# Patient Record
Sex: Female | Born: 1995 | Race: White | Hispanic: No | Marital: Single | Smoking: Current some day smoker
Health system: Southern US, Community
[De-identification: ages and names within clinical notes are randomized; demographics above are authoritative.]

---
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
## 2018-07-05 ENCOUNTER — Other Ambulatory Visit: Payer: Self-pay

## 2018-07-05 ENCOUNTER — Encounter: Payer: Self-pay | Admitting: Emergency Medicine

## 2018-07-05 ENCOUNTER — Emergency Department
Admission: EM | Admit: 2018-07-05 | Discharge: 2018-07-05 | Disposition: A | Discharge status: Home / Self Care | Payer: PRIVATE HEALTH INSURANCE | Attending: Emergency Medicine | Admitting: Emergency Medicine

## 2018-07-05 ENCOUNTER — Emergency Department: Payer: PRIVATE HEALTH INSURANCE

## 2018-07-05 DIAGNOSIS — Z5321 Procedure and treatment not carried out due to patient leaving prior to being seen by health care provider: Secondary | ICD-10-CM | POA: Diagnosis not present

## 2018-07-05 DIAGNOSIS — R109 Unspecified abdominal pain: Secondary | ICD-10-CM | POA: Insufficient documentation

## 2018-07-05 LAB — BASIC METABOLIC PANEL
Anion gap: 8 (ref 5–15)
BUN: 7 mg/dL (ref 6–20)
CO2: 25 mmol/L (ref 22–32)
Calcium: 8.7 mg/dL — ABNORMAL LOW (ref 8.9–10.3)
Chloride: 104 mmol/L (ref 98–111)
Creatinine, Ser: 0.63 mg/dL (ref 0.44–1.00)
GFR calc Af Amer: >60 mL/min (ref >60)
GFR calc non Af Amer: >60 mL/min (ref >60)
Glucose, Bld: 139 mg/dL — ABNORMAL HIGH (ref 70–99)
Potassium: 3.5 mmol/L (ref 3.5–5.1)
Sodium: 137 mmol/L (ref 135–145)

## 2018-07-05 LAB — CBC
HCT: 38.6 % (ref 36.0–46.0)
Hemoglobin: 13 g/dL (ref 12.0–15.0)
MCH: 30.7 pg (ref 26.0–34.0)
MCHC: 33.7 g/dL (ref 30.0–36.0)
MCV: 91.3 fL (ref 80.0–100.0)
Platelets: 317 10*3/uL (ref 150–400)
RBC: 4.23 MIL/uL (ref 3.87–5.11)
RDW: 12 % (ref 11.5–15.5)
WBC: 8 10*3/uL (ref 4.0–10.5)
nRBC: 0 % (ref 0.0–0.2)

## 2018-07-05 LAB — URINALYSIS, COMPLETE (UACMP) WITH MICROSCOPIC
Bacteria, UA: NONE SEEN
Bilirubin Urine: NEGATIVE
Glucose, UA: NEGATIVE mg/dL
Hgb urine dipstick: NEGATIVE
Ketones, ur: NEGATIVE mg/dL
Leukocytes,Ua: NEGATIVE
Nitrite: NEGATIVE
Protein, ur: NEGATIVE mg/dL
Specific Gravity, Urine: 1.014 (ref 1.005–1.030)
Squamous Epithelial / LPF: NONE SEEN (ref 0–5)
WBC, UA: NONE SEEN WBC/hpf (ref 0–5)
pH: 7 (ref 5.0–8.0)

## 2018-07-05 LAB — HCG, QUANTITATIVE, PREGNANCY: hCG, Beta Chain, Quant, S: 25816 m[IU]/mL — ABNORMAL HIGH (ref <5)

## 2018-07-05 NOTE — ED Notes (Signed)
Patient called in lobby. No answer. Patient not in bathroom or outside.

## 2018-07-05 NOTE — ED Notes (Signed)
Patient called. No answer. Patient not outside or in restroom.

## 2018-07-05 NOTE — ED Triage Notes (Signed)
First NUrse Notes:  Arrives with positive pregnancy test this morning.  Patient has an IUD in place. Sent to ED by OBGYN to evaluate for ectopic pregnancy.  Patient is AAOx3.  Skin warm and dry. NAD

## 2018-07-05 NOTE — ED Notes (Signed)
This RN unable to find patient in lobby, outside, or in restrooms. Patient's home and mobile numbers called; no answer to either number. Patient has not set up identification on messaging system, so no message left.

## 2018-07-05 NOTE — ED Notes (Signed)
Ultrasound tech informed this RN that she is unable to find patient in lobby. This RN assisted tech and was also unable to find patient in lobby, outside, or in bathrooms.

## 2018-07-05 NOTE — ED Triage Notes (Signed)
Pt sent to ER by OB/GYN to rule out ectopic pregnancy as she has IUD in place.  Pt states she is approx 1 1/2 weeks late for her period.  States some mild cramping, denies vaginal bleeding.

## 2018-11-25 ENCOUNTER — Emergency Department
Admission: EM | Admit: 2018-11-25 | Discharge: 2018-11-25 | Disposition: A | Discharge status: Home / Self Care | Payer: Medicaid Other | Attending: Emergency Medicine | Admitting: Emergency Medicine

## 2018-11-25 ENCOUNTER — Other Ambulatory Visit: Payer: Self-pay

## 2018-11-25 ENCOUNTER — Emergency Department: Payer: Medicaid Other

## 2018-11-25 ENCOUNTER — Encounter: Payer: Self-pay | Admitting: Intensive Care

## 2018-11-25 DIAGNOSIS — R0789 Other chest pain: Secondary | ICD-10-CM | POA: Diagnosis present

## 2018-11-25 DIAGNOSIS — F17210 Nicotine dependence, cigarettes, uncomplicated: Secondary | ICD-10-CM | POA: Insufficient documentation

## 2018-11-25 DIAGNOSIS — M940 Chondrocostal junction syndrome [Tietze]: Secondary | ICD-10-CM | POA: Insufficient documentation

## 2018-11-25 LAB — BASIC METABOLIC PANEL
Anion gap: 11 (ref 5–15)
BUN: 9 mg/dL (ref 6–20)
CO2: 25 mmol/L (ref 22–32)
Calcium: 9 mg/dL (ref 8.9–10.3)
Chloride: 104 mmol/L (ref 98–111)
Creatinine, Ser: 0.58 mg/dL (ref 0.44–1.00)
GFR calc Af Amer: >60 mL/min (ref >60)
GFR calc non Af Amer: >60 mL/min (ref >60)
Glucose, Bld: 100 mg/dL — ABNORMAL HIGH (ref 70–99)
Potassium: 3.7 mmol/L (ref 3.5–5.1)
Sodium: 140 mmol/L (ref 135–145)

## 2018-11-25 LAB — FIBRIN DERIVATIVES D-DIMER (ARMC ONLY): Fibrin derivatives D-dimer (ARMC): 154.96 ng/mL (FEU) (ref 0.00–499.00)

## 2018-11-25 LAB — CBC
HCT: 39 % (ref 36.0–46.0)
Hemoglobin: 12.9 g/dL (ref 12.0–15.0)
MCH: 28.9 pg (ref 26.0–34.0)
MCHC: 33.1 g/dL (ref 30.0–36.0)
MCV: 87.4 fL (ref 80.0–100.0)
Platelets: 412 10*3/uL — ABNORMAL HIGH (ref 150–400)
RBC: 4.46 MIL/uL (ref 3.87–5.11)
RDW: 12.2 % (ref 11.5–15.5)
WBC: 10.5 10*3/uL (ref 4.0–10.5)
nRBC: 0 % (ref 0.0–0.2)

## 2018-11-25 LAB — TROPONIN I (HIGH SENSITIVITY)
Troponin I (High Sensitivity): <2 ng/L (ref <18)
Troponin I (High Sensitivity): <2 ng/L (ref <18)

## 2018-11-25 LAB — POCT PREGNANCY, URINE: Preg Test, Ur: NEGATIVE

## 2018-11-25 MED ORDER — NAPROXEN 500 MG PO TABS: 500.0000 mg | ORAL_TABLET | Freq: Two times a day (BID) | ORAL | 0 refills | Status: AC

## 2018-11-25 MED ORDER — CYCLOBENZAPRINE HCL 10 MG PO TABS: 10.0000 mg | ORAL_TABLET | Freq: Three times a day (TID) | ORAL | 0 refills | Status: DC | PRN

## 2018-11-25 MED ORDER — KETOROLAC TROMETHAMINE 30 MG/ML IJ SOLN
30.0000 mg | Freq: Once | INTRAMUSCULAR | Status: AC
  Administered 2018-11-25: 30 mg via INTRAVENOUS
  Filled 2018-11-25: qty 1

## 2018-11-25 NOTE — ED Triage Notes (Signed)
Patient c/o right sided stabbing chest pain X3 weeks. No radiation Hard to move, lift stuff, or sleep on that side.

## 2018-11-25 NOTE — Discharge Instructions (Signed)
Take the prescribed Naproxen twice a day with food. Do NOT take Alleve, Ibuprofen, or other NSAID medications while taking this.  Take Tylenol 1000 mg every 6 hours for additional pain control  Try the Flexeril prescribed for muscle pain/spasms

## 2018-11-25 NOTE — ED Notes (Signed)
See triage note  Presents with pain to right side of chest  Pain is mainly under right breast area   States she has had a cold recently  Pain increases with cough or movement  States nothing makes the pain better or worse  Low grade temp on arrival

## 2018-11-25 NOTE — ED Notes (Signed)
Pt called to be roomed with no response. Will try again.

## 2018-11-25 NOTE — ED Provider Notes (Signed)
[facility] Emergency Department Provider Note  ____________________________________________   First MD Initiated Contact with Patient 11/25/18 1927     (approximate)  I have reviewed the triage vital signs and the nursing notes.   HISTORY  Chief Complaint Chest Pain    HPI Dawn Valdez is a 23 y.o. female  Here with chest pain. Pt reports that for the past 3 weeks, she has had constant, unchanging, sharp, positional, right sided chest pain. The pain is worse along the right lower chest wall, positional, worse w/ movement and palpation. No alleviating factors. Pain is slightly worse w/ inspiration. No cough. No hemoptysis. No LE swelling. No other sx. She is on OCPs but ran out a week ago.       History reviewed. No pertinent past medical history.  There are no active problems to display for this patient.   History reviewed. No pertinent surgical history.  Prior to Admission medications   Medication Sig Start Date End Date Taking? Authorizing Provider  cyclobenzaprine (FLEXERIL) 10 MG tablet Take 1 tablet (10 mg total) by mouth 3 (three) times daily as needed for muscle spasms. 11/25/18   Duffy Bruce, MD  ibuprofen (ADVIL,MOTRIN) 200 MG tablet Take 400 mg by mouth every 6 (six) hours as needed for headache (headache).    [provider]  naproxen (NAPROSYN) 500 MG tablet Take 1 tablet (500 mg total) by mouth 2 (two) times daily with a meal for 7 days. 11/25/18 12/02/18  Duffy Bruce, MD  norethindrone-ethinyl estradiol-iron (ESTROSTEP FE,TILIA FE,TRI-LEGEST FE) 1-20/1-30/1-35 MG-MCG tablet Take 1 tablet by mouth at bedtime.    [provider]    Allergies Patient has no known allergies.  History reviewed. No pertinent family history.  Social History Social History   Tobacco Use  . Smoking status: Current Some Day Smoker    Types: Cigarettes  . Smokeless tobacco: Never Used  Substance Use Topics  . Alcohol use: Yes     Comment: occ  . Drug use: Never    Review of Systems  Review of Systems  Constitutional: Negative for fatigue and fever.  HENT: Negative for congestion and sore throat.   Eyes: Negative for visual disturbance.  Respiratory: Positive for chest tightness. Negative for cough and shortness of breath.   Cardiovascular: Positive for chest pain.  Gastrointestinal: Negative for abdominal pain, diarrhea, nausea and vomiting.  Genitourinary: Negative for flank pain.  Musculoskeletal: Negative for back pain and neck pain.  Skin: Negative for rash and wound.  Neurological: Negative for weakness.  All other systems reviewed and are negative.    ____________________________________________  PHYSICAL EXAM:      VITAL SIGNS: ED Triage Vitals  Enc Vitals Group     BP 11/25/18 1732 135/84     Pulse Rate 11/25/18 1732 85     Resp 11/25/18 1732 16     Temp 11/25/18 1732 99.4 F (37.4 C)     Temp Source 11/25/18 1732 Oral     SpO2 11/25/18 1732 99 %     Weight 11/25/18 1735 110 lb (49.9 kg)     Height 11/25/18 1735 5\' 7"  (1.702 m)     Head Circumference --      Peak Flow --      Pain Score 11/25/18 1734 0     Pain Loc --      Pain Edu? --      Excl. in [place]? --      Physical Exam Vitals signs and nursing  note reviewed.  Constitutional:      General: She is not in acute distress.    Appearance: She is well-developed.  HENT:     Head: Normocephalic and atraumatic.  Eyes:     Conjunctiva/sclera: Conjunctivae normal.  Neck:     Musculoskeletal: Neck supple.  Cardiovascular:     Rate and Rhythm: Normal rate.     Heart sounds: Normal heart sounds. No murmur. No friction rub.  Pulmonary:     Effort: Pulmonary effort is normal. No respiratory distress.     Breath sounds: Normal breath sounds. No wheezing or rales.     Comments: TTP over right anterolateral chest wall. No bruising or deformity. No redness, rash, lesions. Abdominal:     General: There is no distension.      Palpations: Abdomen is soft.     Tenderness: There is no abdominal tenderness.  Skin:    General: Skin is warm.     Capillary Refill: Capillary refill takes less than 2 seconds.  Neurological:     Mental Status: She is alert and oriented to person, place, and time.     Motor: No abnormal muscle tone.       ____________________________________________   LABS (all labs ordered are listed, but only abnormal results are displayed)  Labs Reviewed  BASIC METABOLIC PANEL - Abnormal; Notable for the following components:      Result Value   Glucose, Bld 100 (*)    All other components within normal limits  CBC - Abnormal; Notable for the following components:   Platelets 412 (*)    All other components within normal limits  FIBRIN DERIVATIVES D-DIMER ([facility] ONLY)  POC URINE PREG, ED  POCT PREGNANCY, URINE  TROPONIN I (HIGH SENSITIVITY)  TROPONIN I (HIGH SENSITIVITY)    ____________________________________________  EKG: Normal sinus rhythm, 84. PR 140, QRS 86, QTc 444. No acute ST elevations or depressions. ________________________________________  RADIOLOGY All imaging, including plain films, CT scans, and ultrasounds, independently reviewed by me, and interpretations confirmed via formal radiology reads.  ED MD interpretation:   CXR: Clear  Official radiology report(s): Dg Chest 2 View  Result Date: 11/25/2018 CLINICAL DATA:  Consistent right-sided chest pain under breast for 3 weeks EXAM: CHEST - 2 VIEW COMPARISON:  None. FINDINGS: No consolidation, features of edema, pneumothorax, or effusion. Pulmonary vascularity is normally distributed. The cardiomediastinal contours are unremarkable. No acute osseous or soft tissue abnormality. IMPRESSION: No acute cardiopulmonary abnormality. Electronically Signed   By: Kreg Shropshire M.D.   On: 11/25/2018 18:07    ____________________________________________  PROCEDURES   Procedure(s) performed (including Critical Care):   Procedures  ____________________________________________  INITIAL IMPRESSION / MDM / ASSESSMENT AND PLAN / ED COURSE  As part of my medical decision making, I reviewed the following data within the electronic MEDICAL RECORD NUMBER Nursing notes reviewed and incorporated, Old chart reviewed, Notes from prior ED visits, and [place] Controlled Substance Database       *Brande Uncapher was evaluated in Emergency Department on 11/25/2018 for the symptoms described in the history of present illness. She was evaluated in the context of the global COVID-19 pandemic, which necessitated consideration that the patient might be at risk for infection with the SARS-CoV-2 virus that causes COVID-19. Institutional protocols and algorithms that pertain to the evaluation of patients at risk for COVID-19 are in a state of rapid change based on information released by regulatory bodies including the CDC and federal and state organizations. These policies and algorithms were followed  during the patient's care in the ED.  Some ED evaluations and interventions may be delayed as a result of limited staffing during the pandemic.*  Clinical Course as of Nov 24 2253  Mon Nov 25, 2018  1957 23 yo F here with atypical, reproducible, likely MSK chest wall pain. Pain is reproducible on exam. Trop neg x 2 with nonischemic EKG and HEART<3 - doubt ACS. No signs of DVT/PE clinically and D-Dimer is negative. Tx with NSAIDs, outpt follow-up.   [CI]    Clinical Course User Index [CI] Shaune PollackIsaacs, Mishawn Hemann, MD    Medical Decision Making:  As above.   ____________________________________________  FINAL CLINICAL IMPRESSION(S) / ED DIAGNOSES  Final diagnoses:  Atypical chest pain  Costochondritis     MEDICATIONS GIVEN DURING THIS VISIT:  Medications  ketorolac (TORADOL) 30 MG/ML injection 30 mg (30 mg Intravenous Given 11/25/18 2037)     ED Discharge Orders         Ordered    cyclobenzaprine (FLEXERIL) 10 MG tablet  3 times daily  PRN     11/25/18 2018    naproxen (NAPROSYN) 500 MG tablet  2 times daily with meals     11/25/18 2018           Note:  This document was prepared using Dragon voice recognition software and may include unintentional dictation errors.   Shaune PollackIsaacs, Duilio Heritage, MD 11/25/18 2255

## 2019-02-01 ENCOUNTER — Emergency Department (HOSPITAL_COMMUNITY): Payer: Medicaid Other

## 2019-02-01 ENCOUNTER — Inpatient Hospital Stay (HOSPITAL_COMMUNITY)
Admission: EM | Admit: 2019-02-01 | Discharge: 2019-02-06 | DRG: 201 | Discharge status: Against Medical Advice | Payer: Medicaid Other | Attending: General Surgery | Admitting: General Surgery

## 2019-02-01 DIAGNOSIS — Z20822 Contact with and (suspected) exposure to covid-19: Secondary | ICD-10-CM | POA: Diagnosis present

## 2019-02-01 DIAGNOSIS — F17210 Nicotine dependence, cigarettes, uncomplicated: Secondary | ICD-10-CM | POA: Diagnosis present

## 2019-02-01 DIAGNOSIS — Z5329 Procedure and treatment not carried out because of patient's decision for other reasons: Secondary | ICD-10-CM | POA: Diagnosis not present

## 2019-02-01 DIAGNOSIS — S270XXA Traumatic pneumothorax, initial encounter: Secondary | ICD-10-CM | POA: Diagnosis present

## 2019-02-01 DIAGNOSIS — S022XXA Fracture of nasal bones, initial encounter for closed fracture: Secondary | ICD-10-CM | POA: Diagnosis present

## 2019-02-01 DIAGNOSIS — R0602 Shortness of breath: Secondary | ICD-10-CM | POA: Diagnosis present

## 2019-02-01 DIAGNOSIS — Z793 Long term (current) use of hormonal contraceptives: Secondary | ICD-10-CM

## 2019-02-01 DIAGNOSIS — J939 Pneumothorax, unspecified: Secondary | ICD-10-CM

## 2019-02-01 DIAGNOSIS — Y92410 Unspecified street and highway as the place of occurrence of the external cause: Secondary | ICD-10-CM

## 2019-02-01 DIAGNOSIS — Z23 Encounter for immunization: Secondary | ICD-10-CM

## 2019-02-01 DIAGNOSIS — S80812A Abrasion, left lower leg, initial encounter: Secondary | ICD-10-CM | POA: Diagnosis present

## 2019-02-01 DIAGNOSIS — S0081XA Abrasion of other part of head, initial encounter: Secondary | ICD-10-CM | POA: Diagnosis present

## 2019-02-01 DIAGNOSIS — J9690 Respiratory failure, unspecified, unspecified whether with hypoxia or hypercapnia: Secondary | ICD-10-CM

## 2019-02-01 DIAGNOSIS — F10920 Alcohol use, unspecified with intoxication, uncomplicated: Secondary | ICD-10-CM

## 2019-02-01 LAB — COMPREHENSIVE METABOLIC PANEL
ALT: 17 U/L (ref 0–44)
AST: 22 U/L (ref 15–41)
Albumin: 4.2 g/dL (ref 3.5–5.0)
Alkaline Phosphatase: 58 U/L (ref 38–126)
Anion gap: 10 (ref 5–15)
BUN: 7 mg/dL (ref 6–20)
CO2: 25 mmol/L (ref 22–32)
Calcium: 8.9 mg/dL (ref 8.9–10.3)
Chloride: 109 mmol/L (ref 98–111)
Creatinine, Ser: 0.65 mg/dL (ref 0.44–1.00)
GFR calc Af Amer: >60 mL/min (ref >60)
GFR calc non Af Amer: >60 mL/min (ref >60)
Glucose, Bld: 114 mg/dL — ABNORMAL HIGH (ref 70–99)
Potassium: 3.7 mmol/L (ref 3.5–5.1)
Sodium: 144 mmol/L (ref 135–145)
Total Bilirubin: 1.1 mg/dL (ref 0.3–1.2)
Total Protein: 6.7 g/dL (ref 6.5–8.1)

## 2019-02-01 LAB — CBC WITH DIFFERENTIAL/PLATELET
Abs Immature Granulocytes: 0.05 10*3/uL (ref 0.00–0.07)
Basophils Absolute: 0 10*3/uL (ref 0.0–0.1)
Basophils Relative: 0 %
Eosinophils Absolute: 0 10*3/uL (ref 0.0–0.5)
Eosinophils Relative: 0 %
HCT: 41 % (ref 36.0–46.0)
Hemoglobin: 13.2 g/dL (ref 12.0–15.0)
Immature Granulocytes: 1 %
Lymphocytes Relative: 20 %
Lymphs Abs: 1.9 10*3/uL (ref 0.7–4.0)
MCH: 29.8 pg (ref 26.0–34.0)
MCHC: 32.2 g/dL (ref 30.0–36.0)
MCV: 92.6 fL (ref 80.0–100.0)
Monocytes Absolute: 0.5 10*3/uL (ref 0.1–1.0)
Monocytes Relative: 5 %
Neutro Abs: 7.3 10*3/uL (ref 1.7–7.7)
Neutrophils Relative %: 74 %
Platelets: 352 10*3/uL (ref 150–400)
RBC: 4.43 MIL/uL (ref 3.87–5.11)
RDW: 12.7 % (ref 11.5–15.5)
WBC: 9.7 10*3/uL (ref 4.0–10.5)
nRBC: 0 % (ref 0.0–0.2)

## 2019-02-01 LAB — URINALYSIS, ROUTINE W REFLEX MICROSCOPIC
Bilirubin Urine: NEGATIVE
Glucose, UA: NEGATIVE mg/dL
Hgb urine dipstick: NEGATIVE
Ketones, ur: NEGATIVE mg/dL
Leukocytes,Ua: NEGATIVE
Nitrite: NEGATIVE
Protein, ur: NEGATIVE mg/dL
Specific Gravity, Urine: 1.005 (ref 1.005–1.030)
pH: 6 (ref 5.0–8.0)

## 2019-02-01 LAB — RESPIRATORY PANEL BY RT PCR (FLU A&B, COVID)
Influenza A by PCR: NEGATIVE
Influenza B by PCR: NEGATIVE
SARS Coronavirus 2 by RT PCR: NEGATIVE

## 2019-02-01 LAB — RAPID URINE DRUG SCREEN, HOSP PERFORMED
Amphetamines: NOT DETECTED
Barbiturates: NOT DETECTED
Benzodiazepines: NOT DETECTED
Cocaine: NOT DETECTED
Opiates: NOT DETECTED
Tetrahydrocannabinol: NOT DETECTED

## 2019-02-01 LAB — I-STAT BETA HCG BLOOD, ED (MC, WL, AP ONLY): I-stat hCG, quantitative: <5 m[IU]/mL (ref <5)

## 2019-02-01 LAB — ETHANOL: Alcohol, Ethyl (B): 183 mg/dL — ABNORMAL HIGH (ref <10)

## 2019-02-01 MED ORDER — ONDANSETRON 4 MG PO TBDP: 4.0000 mg | ORAL_TABLET | Freq: Four times a day (QID) | ORAL | Status: DC | PRN

## 2019-02-01 MED ORDER — ENOXAPARIN SODIUM 40 MG/0.4ML ~~LOC~~ SOLN
40.0000 mg | SUBCUTANEOUS | Status: DC
  Administered 2019-02-01 – 2019-02-05 (×5): 40 mg via SUBCUTANEOUS
  Filled 2019-02-01 (×5): qty 0.4

## 2019-02-01 MED ORDER — LORAZEPAM 2 MG/ML IJ SOLN
1.0000 mg | Freq: Once | INTRAMUSCULAR | Status: DC
  Filled 2019-02-01: qty 1

## 2019-02-01 MED ORDER — LIDOCAINE-EPINEPHRINE (PF) 2 %-1:200000 IJ SOLN
20.0000 mL | Freq: Once | INTRAMUSCULAR | Status: AC
  Administered 2019-02-01: 20 mL via INTRADERMAL
  Filled 2019-02-01: qty 20

## 2019-02-01 MED ORDER — ACETAMINOPHEN 500 MG PO TABS
1000.0000 mg | ORAL_TABLET | Freq: Three times a day (TID) | ORAL | Status: DC
  Administered 2019-02-01 – 2019-02-06 (×15): 1000 mg via ORAL
  Filled 2019-02-01 (×15): qty 2

## 2019-02-01 MED ORDER — DOCUSATE SODIUM 100 MG PO CAPS
100.0000 mg | ORAL_CAPSULE | Freq: Two times a day (BID) | ORAL | Status: DC
  Administered 2019-02-01 – 2019-02-05 (×7): 100 mg via ORAL
  Filled 2019-02-01 (×9): qty 1

## 2019-02-01 MED ORDER — KETOROLAC TROMETHAMINE 30 MG/ML IJ SOLN: 15.0000 mg | Freq: Three times a day (TID) | INTRAMUSCULAR | Status: AC | PRN

## 2019-02-01 MED ORDER — FENTANYL CITRATE (PF) 100 MCG/2ML IJ SOLN
50.0000 ug | Freq: Once | INTRAMUSCULAR | Status: AC
  Administered 2019-02-01: 50 ug via INTRAVENOUS

## 2019-02-01 MED ORDER — ONDANSETRON HCL 4 MG/2ML IJ SOLN
4.0000 mg | Freq: Once | INTRAMUSCULAR | Status: AC
  Administered 2019-02-01: 4 mg via INTRAVENOUS
  Filled 2019-02-01: qty 2

## 2019-02-01 MED ORDER — OXYCODONE HCL 5 MG PO TABS: 5.0000 mg | ORAL_TABLET | ORAL | Status: DC | PRN

## 2019-02-01 MED ORDER — MORPHINE SULFATE (PF) 2 MG/ML IV SOLN
1.0000 mg | INTRAVENOUS | Status: DC | PRN
  Administered 2019-02-01 (×3): 2 mg via INTRAVENOUS
  Filled 2019-02-01 (×3): qty 1

## 2019-02-01 MED ORDER — PANTOPRAZOLE SODIUM 40 MG IV SOLR: 40.0000 mg | Freq: Every day | INTRAVENOUS | Status: DC

## 2019-02-01 MED ORDER — OXYCODONE HCL 5 MG PO TABS
10.0000 mg | ORAL_TABLET | ORAL | Status: DC | PRN
  Administered 2019-02-01 – 2019-02-02 (×5): 10 mg via ORAL
  Filled 2019-02-01 (×5): qty 2

## 2019-02-01 MED ORDER — SODIUM CHLORIDE 0.9 % IV BOLUS (SEPSIS)
1000.0000 mL | Freq: Once | INTRAVENOUS | Status: AC
  Administered 2019-02-01: 1000 mL via INTRAVENOUS

## 2019-02-01 MED ORDER — PANTOPRAZOLE SODIUM 40 MG PO TBEC
40.0000 mg | DELAYED_RELEASE_TABLET | Freq: Every day | ORAL | Status: DC
  Administered 2019-02-02: 40 mg via ORAL
  Filled 2019-02-01: qty 1

## 2019-02-01 MED ORDER — TETANUS-DIPHTH-ACELL PERTUSSIS 5-2.5-18.5 LF-MCG/0.5 IM SUSP
0.5000 mL | Freq: Once | INTRAMUSCULAR | Status: AC
  Administered 2019-02-01: 0.5 mL via INTRAMUSCULAR
  Filled 2019-02-01: qty 0.5

## 2019-02-01 MED ORDER — KCL IN DEXTROSE-NACL 20-5-0.45 MEQ/L-%-% IV SOLN
INTRAVENOUS | Status: DC
  Filled 2019-02-01 (×2): qty 1000

## 2019-02-01 MED ORDER — IOHEXOL 300 MG/ML  SOLN
75.0000 mL | Freq: Once | INTRAMUSCULAR | Status: AC | PRN
  Administered 2019-02-01: 75 mL via INTRAVENOUS

## 2019-02-01 MED ORDER — FENTANYL CITRATE (PF) 100 MCG/2ML IJ SOLN
50.0000 ug | Freq: Once | INTRAMUSCULAR | Status: AC
  Administered 2019-02-01: 50 ug via INTRAVENOUS
  Filled 2019-02-01: qty 2

## 2019-02-01 MED ORDER — ONDANSETRON HCL 4 MG/2ML IJ SOLN: 4.0000 mg | Freq: Four times a day (QID) | INTRAMUSCULAR | Status: DC | PRN
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
## 2019-02-01 NOTE — ED Notes (Signed)
Attempted report x 3.  
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
## 2019-02-01 NOTE — ED Notes (Signed)
Attempted report x1. 

## 2019-02-02 ENCOUNTER — Encounter (HOSPITAL_COMMUNITY): Payer: Self-pay

## 2019-02-02 ENCOUNTER — Inpatient Hospital Stay (HOSPITAL_COMMUNITY): Payer: Medicaid Other

## 2019-02-02 LAB — BASIC METABOLIC PANEL
Anion gap: 9 (ref 5–15)
BUN: 5 mg/dL — ABNORMAL LOW (ref 6–20)
CO2: 23 mmol/L (ref 22–32)
Calcium: 8.4 mg/dL — ABNORMAL LOW (ref 8.9–10.3)
Chloride: 105 mmol/L (ref 98–111)
Creatinine, Ser: 0.48 mg/dL (ref 0.44–1.00)
GFR calc Af Amer: >60 mL/min (ref >60)
GFR calc non Af Amer: >60 mL/min (ref >60)
Glucose, Bld: 95 mg/dL (ref 70–99)
Potassium: 3.7 mmol/L (ref 3.5–5.1)
Sodium: 137 mmol/L (ref 135–145)

## 2019-02-02 LAB — CBC
HCT: 37.1 % (ref 36.0–46.0)
Hemoglobin: 12.1 g/dL (ref 12.0–15.0)
MCH: 29.8 pg (ref 26.0–34.0)
MCHC: 32.6 g/dL (ref 30.0–36.0)
MCV: 91.4 fL (ref 80.0–100.0)
Platelets: 248 10*3/uL (ref 150–400)
RBC: 4.06 MIL/uL (ref 3.87–5.11)
RDW: 12.5 % (ref 11.5–15.5)
WBC: 7.4 10*3/uL (ref 4.0–10.5)
nRBC: 0 % (ref 0.0–0.2)

## 2019-02-02 LAB — HIV ANTIBODY (ROUTINE TESTING W REFLEX): HIV Screen 4th Generation wRfx: NONREACTIVE

## 2019-02-02 NOTE — Progress Notes (Signed)
Patient placed on 2L of oxygen, chest tube suction increased to 30 mmHg, incentive spirometer given, patient educated, and flutter valve ordered this am per Dr. Tawana Scale order.
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
## 2019-02-02 NOTE — Progress Notes (Signed)
Patient walked 2 laps around progressive/ICU side of the unit. RN was stand-by and holding chest tube collection chamber. Patient needed minimal assistance. Went back to bed when walk was done with SCD's on.

## 2019-02-02 NOTE — Progress Notes (Signed)
Patient's chest tube has 25 mL output total. The chamber was not marked prior to my shift.

## 2019-02-03 ENCOUNTER — Inpatient Hospital Stay (HOSPITAL_COMMUNITY): Payer: Medicaid Other

## 2019-02-03 MED ORDER — OXYCODONE HCL 5 MG/5ML PO SOLN
5.0000 mg | ORAL | Status: DC | PRN
  Administered 2019-02-04: 10 mg via ORAL
  Filled 2019-02-03 (×2): qty 10

## 2019-02-03 MED ORDER — METHOCARBAMOL 500 MG PO TABS
1000.0000 mg | ORAL_TABLET | Freq: Three times a day (TID) | ORAL | Status: DC
  Administered 2019-02-03 – 2019-02-06 (×10): 1000 mg via ORAL
  Filled 2019-02-03 (×10): qty 2

## 2019-02-03 NOTE — Progress Notes (Signed)
Recieved patient from 4N. Patient alert and oriented x4. No complaints of pain at this time. Chest tube to 30 suction. Call bell within reach. Will continue to monitor.
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
## 2019-02-03 NOTE — Progress Notes (Signed)
Patient transferred to a med-surg unit, per Dr. Bedelia Person. Report called and patient transported to [facility]. All belongings packed and taken with patient.

## 2019-02-04 ENCOUNTER — Inpatient Hospital Stay (HOSPITAL_COMMUNITY): Payer: Medicaid Other

## 2019-02-04 LAB — CBC
HCT: 39.1 % (ref 36.0–46.0)
Hemoglobin: 12.9 g/dL (ref 12.0–15.0)
MCH: 29.5 pg (ref 26.0–34.0)
MCHC: 33 g/dL (ref 30.0–36.0)
MCV: 89.5 fL (ref 80.0–100.0)
Platelets: 316 10*3/uL (ref 150–400)
RBC: 4.37 MIL/uL (ref 3.87–5.11)
RDW: 12.5 % (ref 11.5–15.5)
WBC: 9.2 10*3/uL (ref 4.0–10.5)
nRBC: 0 % (ref 0.0–0.2)

## 2019-02-04 LAB — PHOSPHORUS: Phosphorus: 3.6 mg/dL (ref 2.5–4.6)

## 2019-02-04 LAB — MAGNESIUM: Magnesium: 1.6 mg/dL — ABNORMAL LOW (ref 1.7–2.4)

## 2019-02-04 LAB — BASIC METABOLIC PANEL
Anion gap: 8 (ref 5–15)
BUN: 11 mg/dL (ref 6–20)
CO2: 27 mmol/L (ref 22–32)
Calcium: 8.7 mg/dL — ABNORMAL LOW (ref 8.9–10.3)
Chloride: 105 mmol/L (ref 98–111)
Creatinine, Ser: 0.67 mg/dL (ref 0.44–1.00)
GFR calc Af Amer: >60 mL/min (ref >60)
GFR calc non Af Amer: >60 mL/min (ref >60)
Glucose, Bld: 108 mg/dL — ABNORMAL HIGH (ref 70–99)
Potassium: 3.4 mmol/L — ABNORMAL LOW (ref 3.5–5.1)
Sodium: 140 mmol/L (ref 135–145)
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
## 2019-02-04 NOTE — Plan of Care (Signed)
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
## 2019-02-05 ENCOUNTER — Inpatient Hospital Stay (HOSPITAL_COMMUNITY): Payer: Medicaid Other

## 2019-02-05 LAB — CBC
HCT: 38.7 % (ref 36.0–46.0)
Hemoglobin: 13 g/dL (ref 12.0–15.0)
MCH: 29.9 pg (ref 26.0–34.0)
MCHC: 33.6 g/dL (ref 30.0–36.0)
MCV: 89 fL (ref 80.0–100.0)
Platelets: 307 10*3/uL (ref 150–400)
RBC: 4.35 MIL/uL (ref 3.87–5.11)
RDW: 12.5 % (ref 11.5–15.5)
WBC: 8 10*3/uL (ref 4.0–10.5)
nRBC: 0 % (ref 0.0–0.2)

## 2019-02-05 LAB — BASIC METABOLIC PANEL
Anion gap: 8 (ref 5–15)
BUN: 10 mg/dL (ref 6–20)
CO2: 26 mmol/L (ref 22–32)
Calcium: 8.9 mg/dL (ref 8.9–10.3)
Chloride: 104 mmol/L (ref 98–111)
Creatinine, Ser: 0.54 mg/dL (ref 0.44–1.00)
GFR calc Af Amer: >60 mL/min (ref >60)
GFR calc non Af Amer: >60 mL/min (ref >60)
Glucose, Bld: 96 mg/dL (ref 70–99)
Potassium: 4.1 mmol/L (ref 3.5–5.1)
Sodium: 138 mmol/L (ref 135–145)

## 2019-02-05 LAB — PHOSPHORUS: Phosphorus: 3.8 mg/dL (ref 2.5–4.6)

## 2019-02-05 LAB — MAGNESIUM: Magnesium: 1.8 mg/dL (ref 1.7–2.4)

## 2019-02-05 MED ORDER — MAGNESIUM SULFATE 2 GM/50ML IV SOLN
2.0000 g | Freq: Once | INTRAVENOUS | Status: AC
  Administered 2019-02-05: 2 g via INTRAVENOUS
  Filled 2019-02-05: qty 50
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
## 2019-02-05 NOTE — Plan of Care (Signed)

## 2019-02-06 ENCOUNTER — Inpatient Hospital Stay (HOSPITAL_COMMUNITY): Payer: Medicaid Other

## 2019-02-06 LAB — CBC
HCT: 40.2 % (ref 36.0–46.0)
Hemoglobin: 13.2 g/dL (ref 12.0–15.0)
MCH: 29.5 pg (ref 26.0–34.0)
MCHC: 32.8 g/dL (ref 30.0–36.0)
MCV: 89.9 fL (ref 80.0–100.0)
Platelets: 329 10*3/uL (ref 150–400)
RBC: 4.47 MIL/uL (ref 3.87–5.11)
RDW: 12.5 % (ref 11.5–15.5)
WBC: 8.7 10*3/uL (ref 4.0–10.5)
nRBC: 0 % (ref 0.0–0.2)

## 2019-02-06 LAB — MAGNESIUM: Magnesium: 2.1 mg/dL (ref 1.7–2.4)

## 2019-02-06 LAB — BASIC METABOLIC PANEL
Anion gap: 8 (ref 5–15)
BUN: 14 mg/dL (ref 6–20)
CO2: 27 mmol/L (ref 22–32)
Calcium: 9.1 mg/dL (ref 8.9–10.3)
Chloride: 104 mmol/L (ref 98–111)
Creatinine, Ser: 0.63 mg/dL (ref 0.44–1.00)
GFR calc Af Amer: >60 mL/min (ref >60)
GFR calc non Af Amer: >60 mL/min (ref >60)
Glucose, Bld: 98 mg/dL (ref 70–99)
Potassium: 4 mmol/L (ref 3.5–5.1)
Sodium: 139 mmol/L (ref 135–145)

## 2019-02-06 LAB — PHOSPHORUS: Phosphorus: 4.5 mg/dL (ref 2.5–4.6)

## 2019-02-06 NOTE — Discharge Planning (Signed)
Pt signed AMA form. Discharge paperwork reviewed.

## 2019-02-06 NOTE — Progress Notes (Addendum)
CXR reviewed this AM and persistent PTX. Requested nursing staff transition to back to suction, however patient declined, requesting to leave the hospital. I personally came to evaluate the patient and discussed her indication to stay in the hospital. We discussed her persistent pneumothorax, as well as possible conversion to tension pneumothorax if she left the hospital. It was clearly explained that she would be at risk of worsening her condition or death by leaving the hospital against medical advice. The patient verbalized that she accepts these risks. I explained that we would remove the chest tube prior to her discharge and instructed her to have a low threshold to return to the hospital, especially if she developed any chest pain, shortness of breath, or any other concerning symptoms. She was counseled to take her incentive spirometer and continue using it after leaving the hospital. She verbalized understanding and agreement.   Diamantina Monks, MD General and Trauma Surgery [facility]

## 2019-02-06 NOTE — Discharge Instructions (Signed)
PNEUMOTHORAX OR HEMOTHORAX +/- RIB FRACTURES  HOME INSTRUCTIONS   1. PAIN CONTROL:  1. Pain is best controlled by a usual combination of three different methods TOGETHER:  i. Ice/Heat ii. Over the counter pain medication iii. Prescription pain medication 2. You may experience some swelling and bruising in area of broken ribs. Ice packs or heating pads (30-60 minutes up to 6 times a day) will help. Use ice for the first few days to help decrease swelling and bruising, then switch to heat to help relax tight/sore spots and speed recovery. Some people prefer to use ice alone, heat alone, alternating between ice & heat. Experiment to what works for you. Swelling and bruising can take several weeks to resolve.  3. It is helpful to take an over-the-counter pain medication regularly for the first few weeks. Choose one of the following that works best for you:  i. Naproxen (Aleve, etc) Two 220mg tabs twice a day ii. Ibuprofen (Advil, etc) Three 200mg tabs four times a day (every meal & bedtime) iii. Acetaminophen (Tylenol, etc) 500-650mg four times a day (every meal & bedtime) 4. A prescription for pain medication (such as oxycodone, hydrocodone, etc) may be given to you upon discharge. Take your pain medication as prescribed.  i. If you are having problems/concerns with the prescription medicine (does not control pain, nausea, vomiting, rash, itching, etc), please call us [phone] to see if we need to switch you to a different pain medicine that will work better for you and/or control your side effect better. ii. If you need a refill on your pain medication, please contact your pharmacy. They will contact our office to request authorization. Prescriptions will not be filled after 5 pm or on week-ends. 1. Avoid getting constipated. When taking pain medications, it is common to experience some constipation. Increasing fluid intake and taking a fiber supplement (such as Metamucil, Citrucel, FiberCon,  MiraLax, etc) 1-2 times a day regularly will usually help prevent this problem from occurring. A mild laxative (prune juice, Milk of Magnesia, MiraLax, etc) should be taken according to package directions if there are no bowel movements after 48 hours.  2. Watch out for diarrhea. If you have many loose bowel movements, simplify your diet to bland foods & liquids for a few days. Stop any stool softeners and decrease your fiber supplement. Switching to mild anti-diarrheal medications (Kayopectate, Pepto Bismol) can help. If this worsens or does not improve, please call us. 3. Chest tube site wound: you may remove the dressing from your chest tube site 3 days after the removal of your chest tube. DO NOT shower over the dressing. Once   removed, you may shower as normal. Do not submerge your wound in water for 2-3 weeks.  4. FOLLOW UP  a. Please call our office to set up or confirm an appointment for follow up for 2 weeks after discharge. You will need to get a chest xray at either [facility] or [facility]. This will be outlined in your follow up instructions. Please call [facility] at [phone] if you have any questions about follow up.  b. If you have any orthopedic or other injuries you will need to follow up as outlined in your follow up instructions.   WHEN TO CALL US [phone]:  1. Poor pain control 2. Reactions / problems with new medications (rash/itching, nausea, etc)  3. Fever over 101.5 F (38.5 C) 4. Worsening swelling or bruising 5. Redness, drainage, pain or swelling around chest   tube site 6. Worsening pain, productive cough, difficulty breathing or any other concerning symptoms  The clinic staff is available to answer your questions during regular business hours (8:30am-5pm). Please don't hesitate to call and ask to speak to one of our nurses for clinical concerns.  If you have a medical emergency, go to the nearest emergency room or call 911.  A surgeon from [facility] is always on call at the hospitals   [facility]  [address], [address], [place], [place] [address] ?  MAIN: [phone] ? TOLL FREE: [phone] ?  FAX [phone]  [email or web address]      Information on Rib Fractures  A rib fracture is a break or crack in one of the bones of the ribs. The ribs are long, curved bones that wrap around your chest and attach to your spine and your breastbone. The ribs protect your heart, lungs, and other organs in the chest. A broken or cracked rib is often painful but is not usually serious. Most rib fractures heal on their own over time. However, rib fractures can be more serious if multiple ribs are broken or if broken ribs move out of place and push against other structures or organs. What are the causes? This condition is caused by:  Repetitive movements with high force, such as pitching a baseball or having severe coughing spells.  A direct blow to the chest, such as a sports injury, a car accident, or a fall.  Cancer that has spread to the bones, which can weaken bones and cause them to break. What are the signs or symptoms? Symptoms of this condition include:  Pain when you breathe in or cough.  Pain when someone presses on the injured area.  Feeling short of breath. How is this diagnosed? This condition is diagnosed with a physical exam and medical history. Imaging tests may also be done, such as:  Chest X-ray.  CT scan.  MRI.  Bone scan.  Chest ultrasound. How is this treated? Treatment for this condition depends on the severity of the fracture. Most rib fractures usually heal on their own in 1-3 months. Sometimes healing takes longer if there is a cough that does not stop or if there are other activities that make the injury worse (aggravating factors). While you heal, you will be given medicines to control the pain. You will also be taught deep breathing  exercises. Severe injuries may require hospitalization or surgery. Follow these instructions at home: Managing pain, stiffness, and swelling  If directed, apply ice to the injured area. ? Put ice in a plastic bag. ? Place a towel between your skin and the bag. ? Leave the ice on for 20 minutes, 2-3 times a day.  Take over-the-counter and prescription medicines only as told by your health care provider. Activity  Avoid a lot of activity and any activities or movements that cause pain. Be careful during activities and avoid bumping the injured rib.  Slowly increase your activity as told by your health care provider. General instructions  Do deep breathing exercises as told by your health care provider. This helps prevent pneumonia, which is a common complication of a broken rib. Your health care provider may instruct you to: ? Take deep breaths several times a day. ? Try to cough several times a day, holding a pillow against the injured area. ? Use a device called incentive spirometer to practice deep breathing several times a day.  Drink enough   fluid to keep your urine pale yellow.  Do not wear a rib belt or binder. These restrict breathing, which can lead to pneumonia.  Keep all follow-up visits as told by your health care provider. This is important. Contact a health care provider if:  You have a fever. Get help right away if:  You have difficulty breathing or you are short of breath.  You develop a cough that does not stop, or you cough up thick or bloody sputum.  You have nausea, vomiting, or pain in your abdomen.  Your pain gets worse and medicine does not help. Summary  A rib fracture is a break or crack in one of the bones of the ribs.  A broken or cracked rib is often painful but is not usually serious.  Most rib fractures heal on their own over time.  Treatment for this condition depends on the severity of the fracture.  Avoid a lot of activity and any  activities or movements that cause pain. This information is not intended to replace advice given to you by your health care provider. Make sure you discuss any questions you have with your health care provider. Document Released: 12/26/2004 Document Revised: 03/27/2016 Document Reviewed: 03/27/2016 Elsevier Interactive Patient Education  2019 Elsevier Inc[employer]    Pneumothorax A pneumothorax is commonly called a collapsed lung. It is a condition in which air leaks from a lung and builds up between the thin layer of tissue that covers the lungs (visceral pleura) and the interior wall of the chest cavity (parietal pleura). The air gets trapped outside the lung, between the lung and the chest wall (pleural space). The air takes up space and prevents the lung from fully expanding. This condition sometimes occurs suddenly with no apparent cause. The buildup of air may be small or large. A small pneumothorax may go away on its own. A large pneumothorax will require treatment and hospitalization. What are the causes? This condition may be caused by:  Trauma and injury to the chest wall.  Surgery and other medical procedures.  A complication of an underlying lung problem, especially chronic obstructive pulmonary disease (COPD) or emphysema. Sometimes the cause of this condition is not known. What increases the risk? You are more likely to develop this condition if:  You have an underlying lung problem.  You smoke.  You are 20-40 years old, female, tall, and underweight.  You have a personal or family history of pneumothorax.  You have an eating disorder (anorexia nervosa). This condition can also happen quickly, even in people with no history of lung problems. What are the signs or symptoms? Sometimes a pneumothorax will have no symptoms. When symptoms are present, they can include:  Chest pain.  Shortness of breath.  Increased rate of breathing.  Bluish color to your lips or skin  (cyanosis). How is this diagnosed? This condition may be diagnosed by:  A medical history and physical exam.  A chest X-ray, chest CT scan, or ultrasound. How is this treated? Treatment depends on how severe your condition is. The goal of treatment is to remove the extra air and allow your lung to expand back to its normal size.  For a small pneumothorax: ? No treatment may be needed. ? Extra oxygen is sometimes used to make it go away more quickly.  For a large pneumothorax or a pneumothorax that is causing symptoms, a procedure is done to drain the air from your lungs. To do this, a health care provider may   use: ? A needle with a syringe. This is used to suck air from a pleural space where no additional leakage is taking place. ? A chest tube. This is used to suck air where there is ongoing leakage into the pleural space. The chest tube may need to remain in place for several days until the air leak has healed.  In more severe cases, surgery may be needed to repair the damage that is causing the leak.  If you have multiple pneumothorax episodes or have an air leak that will not heal, a procedure called a pleurodesis may be done. A medicine is placed in the pleural space to irritate the tissues around the lung so that the lung will stick to the chest wall, seal any leaks, and stop any buildup of air in that space. If you have an underlying lung problem, severe symptoms, or a large pneumothorax you will usually need to stay in the hospital. Follow these instructions at home: Lifestyle  Do not use any products that contain nicotine or tobacco, such as cigarettes and e-cigarettes. These are major risk factors in pneumothorax. If you need help quitting, ask your health care provider.  Do not lift anything that is heavier than 10 lb (4.5 kg), or the limit that your health care provider tells you, until he or she says that it is safe.  Avoid activities that take a lot of effort (strenuous)  for as long as told by your health care provider.  Return to your normal activities as told by your health care provider. Ask your health care provider what activities are safe for you.  Do not fly in an airplane or scuba dive until your health care provider says it is okay. General instructions  Take over-the-counter and prescription medicines only as told by your health care provider.  If a cough or pain makes it difficult for you to sleep at night, try sleeping in a semi-upright position in a recliner or by using 2 or 3 pillows.  If you had a chest tube and it was removed, ask your health care provider when you can remove the bandage (dressing). While the dressing is in place, do not allow it to get wet.  Keep all follow-up visits as told by your health care provider. This is important. Contact a health care provider if:  You cough up thick mucus (sputum) that is yellow or green in color.  You were treated with a chest tube, and you have redness, increasing pain, or discharge at the site where it was placed. Get help right away if:  You have increasing chest pain or shortness of breath.  You have a cough that will not go away.  You begin coughing up blood.  You have pain that is getting worse or is not controlled with medicines.  The site where your chest tube was located opens up.  You feel air coming out of the site where the chest tube was placed.  You have a fever or persistent symptoms for more than 2-3 days.  You have a fever and your symptoms suddenly get worse. These symptoms may represent a serious problem that is an emergency. Do not wait to see if the symptoms will go away. Get medical help right away. Call your local emergency services (911 in the [place] Do not drive yourself to the hospital. Summary  A pneumothorax, commonly called a collapsed lung, is a condition in which air leaks from a lung and gets trapped between the   lung and the chest wall (pleural  space).  The buildup of air may be small or large. A small pneumothorax may go away on its own. A large pneumothorax will require treatment and hospitalization.  Treatment for this condition depends on how severe the pneumothorax is. The goal of treatment is to remove the extra air and allow the lung to expand back to its normal size. This information is not intended to replace advice given to you by your health care provider. Make sure you discuss any questions you have with your health care provider. Document Released: 12/26/2004 Document Revised: 12/04/2016 Document Reviewed: 12/04/2016 Elsevier Interactive Patient Education  2019 Elsevier Inc.   
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
## 2020-05-21 ENCOUNTER — Other Ambulatory Visit: Payer: Self-pay

## 2020-05-21 ENCOUNTER — Emergency Department: Payer: Self-pay

## 2020-05-21 ENCOUNTER — Emergency Department
Admission: EM | Admit: 2020-05-21 | Discharge: 2020-05-21 | Disposition: A | Discharge status: Home / Self Care | Payer: Self-pay | Attending: Emergency Medicine | Admitting: Emergency Medicine

## 2020-05-21 ENCOUNTER — Encounter: Payer: Self-pay | Admitting: Intensive Care

## 2020-05-21 DIAGNOSIS — S161XXA Strain of muscle, fascia and tendon at neck level, initial encounter: Secondary | ICD-10-CM | POA: Insufficient documentation

## 2020-05-21 DIAGNOSIS — F17210 Nicotine dependence, cigarettes, uncomplicated: Secondary | ICD-10-CM | POA: Insufficient documentation

## 2020-05-21 DIAGNOSIS — S20219A Contusion of unspecified front wall of thorax, initial encounter: Secondary | ICD-10-CM | POA: Insufficient documentation

## 2020-05-21 MED ORDER — IBUPROFEN 600 MG PO TABS: 600.0000 mg | ORAL_TABLET | Freq: Four times a day (QID) | ORAL | 0 refills | Status: AC | PRN
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
## 2020-05-21 NOTE — Discharge Instructions (Signed)
With your primary care provider or [facility] acute care if any continued problems or concerns.  Begin taking medication as directed with food for inflammation and pain.  You may also use ice or heat to your back and neck as needed for discomfort.
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
## 2023-05-08 ENCOUNTER — Ambulatory Visit: Payer: Self-pay
